# Patient Record
Sex: Female | Born: 1970 | Race: White | Hispanic: No | Marital: Married | Smoking: Never smoker
Health system: Southern US, Community
[De-identification: ages and names within clinical notes are randomized; demographics above are authoritative.]

## PROBLEM LIST (undated history)

## (undated) DIAGNOSIS — I10 Essential (primary) hypertension: Secondary | ICD-10-CM

## (undated) DIAGNOSIS — R87619 Unspecified abnormal cytological findings in specimens from cervix uteri: Secondary | ICD-10-CM

## (undated) DIAGNOSIS — I499 Cardiac arrhythmia, unspecified: Secondary | ICD-10-CM

## (undated) DIAGNOSIS — I341 Nonrheumatic mitral (valve) prolapse: Secondary | ICD-10-CM

## (undated) DIAGNOSIS — Z8619 Personal history of other infectious and parasitic diseases: Secondary | ICD-10-CM

## (undated) DIAGNOSIS — IMO0002 Reserved for concepts with insufficient information to code with codable children: Secondary | ICD-10-CM

## (undated) DIAGNOSIS — O09529 Supervision of elderly multigravida, unspecified trimester: Secondary | ICD-10-CM

## (undated) DIAGNOSIS — R011 Cardiac murmur, unspecified: Secondary | ICD-10-CM

## (undated) HISTORY — PX: DILATION AND CURETTAGE OF UTERUS: SHX78

## (undated) HISTORY — DX: Cardiac arrhythmia, unspecified: I49.9

## (undated) HISTORY — DX: Unspecified abnormal cytological findings in specimens from cervix uteri: R87.619

## (undated) HISTORY — DX: Personal history of other infectious and parasitic diseases: Z86.19

## (undated) HISTORY — PX: GYNECOLOGIC CRYOSURGERY: SHX857

## (undated) HISTORY — DX: Cardiac murmur, unspecified: R01.1

## (undated) HISTORY — DX: Nonrheumatic mitral (valve) prolapse: I34.1

## (undated) HISTORY — PX: FOOT SURGERY: SHX648

## (undated) HISTORY — PX: FACIAL COSMETIC SURGERY: SHX629

## (undated) HISTORY — DX: Supervision of elderly multigravida, unspecified trimester: O09.529

## (undated) HISTORY — PX: TONSILLECTOMY: SUR1361

## (undated) HISTORY — DX: Essential (primary) hypertension: I10

## (undated) HISTORY — DX: Reserved for concepts with insufficient information to code with codable children: IMO0002

---

## 2009-06-24 ENCOUNTER — Inpatient Hospital Stay (HOSPITAL_COMMUNITY): Admission: AD | Admit: 2009-06-24 | Discharge: 2009-06-26 | Payer: Self-pay | Admitting: Obstetrics and Gynecology

## 2010-10-14 ENCOUNTER — Inpatient Hospital Stay (HOSPITAL_COMMUNITY): Admission: AD | Admit: 2010-10-14 | Payer: Self-pay | Source: Ambulatory Visit | Admitting: Obstetrics and Gynecology

## 2010-11-03 ENCOUNTER — Encounter: Payer: Self-pay | Admitting: Obstetrics and Gynecology

## 2010-11-04 ENCOUNTER — Encounter: Payer: Self-pay | Admitting: Obstetrics and Gynecology

## 2011-01-18 LAB — CBC
Hemoglobin: 11.3 g/dL — ABNORMAL LOW (ref 12.0–15.0)
Hemoglobin: 11.9 g/dL — ABNORMAL LOW (ref 12.0–15.0)
MCHC: 34.6 g/dL (ref 30.0–36.0)
MCV: 94.6 fL (ref 78.0–100.0)
MCV: 95.6 fL (ref 78.0–100.0)
Platelets: 106 10*3/uL — ABNORMAL LOW (ref 150–400)
Platelets: 109 10*3/uL — ABNORMAL LOW (ref 150–400)
RBC: 3.18 MIL/uL — ABNORMAL LOW (ref 3.87–5.11)
RBC: 3.42 MIL/uL — ABNORMAL LOW (ref 3.87–5.11)
RDW: 12.9 % (ref 11.5–15.5)
WBC: 7.7 10*3/uL (ref 4.0–10.5)
WBC: 9.4 10*3/uL (ref 4.0–10.5)

## 2011-01-18 LAB — RPR: RPR Ser Ql: NONREACTIVE

## 2011-05-01 LAB — ABO/RH: RH Type: POSITIVE

## 2011-05-01 LAB — HIV ANTIBODY (ROUTINE TESTING W REFLEX): HIV: NONREACTIVE

## 2011-05-01 LAB — GC/CHLAMYDIA PROBE AMP, GENITAL
Chlamydia: NEGATIVE
Gonorrhea: NEGATIVE

## 2011-05-01 LAB — ANTIBODY SCREEN: Antibody Screen: NEGATIVE

## 2011-05-01 LAB — RUBELLA ANTIBODY, IGM: Rubella: NON-IMMUNE/NOT IMMUNE

## 2011-05-24 ENCOUNTER — Encounter: Payer: Self-pay | Admitting: *Deleted

## 2011-05-24 ENCOUNTER — Encounter: Payer: Self-pay | Admitting: Cardiovascular Disease

## 2011-05-28 ENCOUNTER — Ambulatory Visit (HOSPITAL_COMMUNITY): Payer: 59 | Attending: Obstetrics and Gynecology | Admitting: Radiology

## 2011-05-28 ENCOUNTER — Encounter: Payer: Self-pay | Admitting: *Deleted

## 2011-05-28 ENCOUNTER — Ambulatory Visit (INDEPENDENT_AMBULATORY_CARE_PROVIDER_SITE_OTHER): Payer: 59 | Admitting: Cardiovascular Disease

## 2011-05-28 VITALS — BP 106/74 | HR 76 | Ht 65.0 in | Wt 166.0 lb

## 2011-05-28 DIAGNOSIS — I491 Atrial premature depolarization: Secondary | ICD-10-CM | POA: Insufficient documentation

## 2011-05-28 DIAGNOSIS — I059 Rheumatic mitral valve disease, unspecified: Secondary | ICD-10-CM

## 2011-05-28 DIAGNOSIS — I341 Nonrheumatic mitral (valve) prolapse: Secondary | ICD-10-CM

## 2011-05-28 DIAGNOSIS — I2510 Atherosclerotic heart disease of native coronary artery without angina pectoris: Secondary | ICD-10-CM | POA: Insufficient documentation

## 2011-05-28 NOTE — Patient Instructions (Signed)
Your physician has requested that you have an echocardiogram. Echocardiography is a painless test that uses sound waves to create images of your heart. It provides your doctor with information about the size and shape of your heart and how well your heart's chambers and valves are working. This procedure takes approximately one hour. There are no restrictions for this procedure.  Your physician recommends that you schedule a follow-up appointment in: as needed with Dr. Eden Emms.

## 2011-05-28 NOTE — Assessment & Plan Note (Signed)
Echo.  Doubt true Barlows syndrome. No murmur on exam  No need for SBe

## 2011-05-28 NOTE — Assessment & Plan Note (Signed)
Benign not current. Likely related to stress.  Observe

## 2011-05-28 NOTE — Progress Notes (Signed)
40 yo G7 [id number] LMP 02/10/11  Who is now [redacted] weeks pregnant.  Referred by Dr Senaida Ores for PAC;s and history of MVP.  Diagnosed in 2000 but patient denies ever having an echo.  Had practiced SBE prophylaxis in past.  Has had gestational HTN in past but never post partum.  Has had previous palpitatons and PAC's during stress of divorce but stable now.  Pregnancy going well with no SSCP, dyspnea, palpitations or edema.  Baby doing well by [place].  Discussed diagnosis with patient.  Not clear to me that she has prolapse.  No murmur on exam and PAC;s may be benign.  Certainly does not need SBE.  Given relatively advanced age with pregnancy with do echo and further assess  Reviewed office notes from Dr Floreen Comber Ob-Gyn  ROS: Denies fever, malais, weight loss, blurry vision, decreased visual acuity, cough, sputum, SOB, hemoptysis, pleuritic pain, palpitaitons, heartburn, abdominal pain, melena, lower extremity edema, claudication, or rash.  All other systems reviewed and negative   General: Affect appropriate Healthy:  appears stated age HEENT: normal Neck supple with no adenopathy JVP normal no bruits no thyromegaly Lungs clear with no wheezing and good diaphragmatic motion Heart:  S1/S2 no murmur,rub, gallop or click PMI normal Abdomen: benighn, BS positve, no tenderness, no AAA no bruit.  No HSM or HJR Distal pulses intact with no bruits No edema Neuro non-focal Skin warm and dry No muscular weakness  Medications Current Outpatient Prescriptions  Medication Sig Dispense Refill  . Prenat w/o A Vit-FeFum-FePo-FA (CONCEPT OB) 130-92.4-1 MG CAPS 1 tab daily        Allergies Review of patient's allergies indicates no known allergies.  Family History: Family History  Problem Relation Age of Onset  . Arthritis Mother     grandmother  . Endometriosis Mother   . Fibromyalgia Mother   . Heart disease      grandparents (both sides of family)  . Hypertension Father     grandparents  (both sides of family)  . Other Mother     still birth  . Hypertension Mother     Social History: History   Social History  . Marital Status: Married    Spouse Name: N/A    Number of Children: N/A  . Years of Education: N/A   Occupational History  . Not on file.   Social History Main Topics  . Smoking status: Never Smoker   . Smokeless tobacco: Not on file  . Alcohol Use: No  . Drug Use: No  . Sexually Active: Not on file   Other Topics Concern  . Not on file   Social History Narrative  . No narrative on file    Electrocardiogram:  NSR 76  Normal ECG  Assessment and Plan

## 2011-05-31 NOTE — Progress Notes (Signed)
pt aware of results Hayley Powell  

## 2011-10-15 NOTE — L&D Delivery Note (Signed)
Delivery Note At 12:23 PM a healthy female was delivered via Vaginal, Spontaneous Delivery (Presentation: Left Occiput Anterior).  APGAR:8,9 weight 8#13oz  Placenta status: Intact, Spontaneous.  Cord:  with the following complications: None.  Anesthesia: epidural  Episiotomy: n/a Lacerations: bilateral labial lacerations Suture Repair: 3.0 vicryl rapide Est. Blood Loss (mL): 350cc  Mom to postpartum.  Baby to nursery-stable.  Hayley Powell 11/14/2011, 12:54 PM

## 2011-11-05 ENCOUNTER — Encounter (HOSPITAL_COMMUNITY): Payer: Self-pay | Admitting: *Deleted

## 2011-11-05 ENCOUNTER — Telehealth (HOSPITAL_COMMUNITY): Payer: Self-pay | Admitting: *Deleted

## 2011-11-05 NOTE — Telephone Encounter (Signed)
Preadmission screen  

## 2011-11-13 ENCOUNTER — Other Ambulatory Visit: Payer: Self-pay | Admitting: Obstetrics and Gynecology

## 2011-11-14 ENCOUNTER — Encounter (HOSPITAL_COMMUNITY): Payer: Self-pay

## 2011-11-14 ENCOUNTER — Encounter (HOSPITAL_COMMUNITY): Payer: Self-pay | Admitting: Anesthesiology

## 2011-11-14 ENCOUNTER — Inpatient Hospital Stay (HOSPITAL_COMMUNITY): Payer: 59 | Admitting: Anesthesiology

## 2011-11-14 ENCOUNTER — Inpatient Hospital Stay (HOSPITAL_COMMUNITY)
Admission: RE | Admit: 2011-11-14 | Discharge: 2011-11-15 | DRG: 774 | Disposition: A | Discharge status: Home / Self Care | Payer: 59 | Source: Ambulatory Visit | Attending: Obstetrics and Gynecology | Admitting: Obstetrics and Gynecology

## 2011-11-14 DIAGNOSIS — O99892 Other specified diseases and conditions complicating childbirth: Secondary | ICD-10-CM | POA: Diagnosis present

## 2011-11-14 DIAGNOSIS — O09529 Supervision of elderly multigravida, unspecified trimester: Secondary | ICD-10-CM | POA: Diagnosis present

## 2011-11-14 DIAGNOSIS — O139 Gestational [pregnancy-induced] hypertension without significant proteinuria, unspecified trimester: Secondary | ICD-10-CM | POA: Diagnosis present

## 2011-11-14 DIAGNOSIS — I059 Rheumatic mitral valve disease, unspecified: Secondary | ICD-10-CM | POA: Diagnosis present

## 2011-11-14 DIAGNOSIS — I2510 Atherosclerotic heart disease of native coronary artery without angina pectoris: Secondary | ICD-10-CM | POA: Diagnosis present

## 2011-11-14 DIAGNOSIS — Z22330 Carrier of Group B streptococcus: Secondary | ICD-10-CM

## 2011-11-14 LAB — COMPREHENSIVE METABOLIC PANEL
ALT: 16 U/L (ref 0–35)
Calcium: 9.1 mg/dL (ref 8.4–10.5)
GFR calc Af Amer: >90 mL/min (ref >90)
Glucose, Bld: 78 mg/dL (ref 70–99)
Sodium: 135 mEq/L (ref 135–145)
Total Protein: 6.2 g/dL (ref 6.0–8.3)

## 2011-11-14 LAB — URIC ACID: Uric Acid, Serum: 5.9 mg/dL (ref 2.4–7.0)

## 2011-11-14 LAB — CBC
Hemoglobin: 13.7 g/dL (ref 12.0–15.0)
MCH: 32.2 pg (ref 26.0–34.0)
MCHC: 34.9 g/dL (ref 30.0–36.0)
Platelets: 104 10*3/uL — ABNORMAL LOW (ref 150–400)
Platelets: 108 10*3/uL — ABNORMAL LOW (ref 150–400)
RDW: 13.1 % (ref 11.5–15.5)
WBC: 13.3 10*3/uL — ABNORMAL HIGH (ref 4.0–10.5)

## 2011-11-14 LAB — URINALYSIS, ROUTINE W REFLEX MICROSCOPIC
Glucose, UA: NEGATIVE mg/dL
Leukocytes, UA: NEGATIVE
Nitrite: NEGATIVE
Protein, ur: NEGATIVE mg/dL
Urobilinogen, UA: 0.2 mg/dL (ref 0.0–1.0)

## 2011-11-14 LAB — URINE MICROSCOPIC-ADD ON

## 2011-11-14 MED ORDER — ONDANSETRON HCL 4 MG/2ML IJ SOLN: 4.0000 mg | Freq: Four times a day (QID) | INTRAMUSCULAR | Status: DC | PRN

## 2011-11-14 MED ORDER — LACTATED RINGERS IV SOLN: INTRAVENOUS | Status: DC

## 2011-11-14 MED ORDER — FLEET ENEMA 7-19 GM/118ML RE ENEM: 1.0000 | ENEMA | RECTAL | Status: DC | PRN

## 2011-11-14 MED ORDER — DEXTROSE 5 % IV SOLN
2.5000 10*6.[IU] | INTRAVENOUS | Status: DC
  Administered 2011-11-14: 2.5 10*6.[IU] via INTRAVENOUS
  Filled 2011-11-14 (×5): qty 2.5

## 2011-11-14 MED ORDER — ACETAMINOPHEN 325 MG PO TABS: 650.0000 mg | ORAL_TABLET | ORAL | Status: DC | PRN

## 2011-11-14 MED ORDER — BENZOCAINE-MENTHOL 20-0.5 % EX AERO
1.0000 "application " | INHALATION_SPRAY | CUTANEOUS | Status: DC | PRN
  Administered 2011-11-14 – 2011-11-15 (×2): 1 via TOPICAL

## 2011-11-14 MED ORDER — DIPHENHYDRAMINE HCL 25 MG PO CAPS: 25.0000 mg | ORAL_CAPSULE | Freq: Four times a day (QID) | ORAL | Status: DC | PRN

## 2011-11-14 MED ORDER — IBUPROFEN 600 MG PO TABS: 600.0000 mg | ORAL_TABLET | Freq: Four times a day (QID) | ORAL | Status: DC | PRN

## 2011-11-14 MED ORDER — SIMETHICONE 80 MG PO CHEW: 80.0000 mg | CHEWABLE_TABLET | ORAL | Status: DC | PRN

## 2011-11-14 MED ORDER — PRENATAL MULTIVITAMIN CH
1.0000 | ORAL_TABLET | Freq: Every day | ORAL | Status: DC
  Administered 2011-11-14 – 2011-11-15 (×2): 1 via ORAL
  Filled 2011-11-14 (×2): qty 1

## 2011-11-14 MED ORDER — OXYCODONE-ACETAMINOPHEN 5-325 MG PO TABS: 1.0000 | ORAL_TABLET | ORAL | Status: DC | PRN

## 2011-11-14 MED ORDER — IBUPROFEN 600 MG PO TABS
600.0000 mg | ORAL_TABLET | Freq: Four times a day (QID) | ORAL | Status: DC
  Administered 2011-11-14 – 2011-11-15 (×3): 600 mg via ORAL
  Filled 2011-11-14 (×3): qty 1

## 2011-11-14 MED ORDER — OXYTOCIN 20 UNITS IN LACTATED RINGERS INFUSION - SIMPLE
1.0000 m[IU]/min | INTRAVENOUS | Status: DC
  Administered 2011-11-14: 2 m[IU]/min via INTRAVENOUS
  Filled 2011-11-14: qty 1000

## 2011-11-14 MED ORDER — BENZOCAINE-MENTHOL 20-0.5 % EX AERO
INHALATION_SPRAY | CUTANEOUS | Status: AC
  Administered 2011-11-14: 1 via TOPICAL
  Filled 2011-11-14: qty 56

## 2011-11-14 MED ORDER — PHENYLEPHRINE 40 MCG/ML (10ML) SYRINGE FOR IV PUSH (FOR BLOOD PRESSURE SUPPORT)
80.0000 ug | PREFILLED_SYRINGE | INTRAVENOUS | Status: DC | PRN
  Filled 2011-11-14: qty 5

## 2011-11-14 MED ORDER — FENTANYL 2.5 MCG/ML BUPIVACAINE 1/10 % EPIDURAL INFUSION (WH - ANES)
14.0000 mL/h | INTRAMUSCULAR | Status: DC
  Filled 2011-11-14: qty 60

## 2011-11-14 MED ORDER — PHENYLEPHRINE 40 MCG/ML (10ML) SYRINGE FOR IV PUSH (FOR BLOOD PRESSURE SUPPORT): 80.0000 ug | PREFILLED_SYRINGE | INTRAVENOUS | Status: DC | PRN

## 2011-11-14 MED ORDER — EPHEDRINE 5 MG/ML INJ: 10.0000 mg | INTRAVENOUS | Status: DC | PRN

## 2011-11-14 MED ORDER — ONDANSETRON HCL 4 MG PO TABS: 4.0000 mg | ORAL_TABLET | ORAL | Status: DC | PRN

## 2011-11-14 MED ORDER — OXYTOCIN 20 UNITS IN LACTATED RINGERS INFUSION - SIMPLE
125.0000 mL/h | Freq: Once | INTRAVENOUS | Status: AC
  Administered 2011-11-14: 125 mL/h via INTRAVENOUS

## 2011-11-14 MED ORDER — CITRIC ACID-SODIUM CITRATE 334-500 MG/5ML PO SOLN: 30.0000 mL | ORAL | Status: DC | PRN

## 2011-11-14 MED ORDER — FENTANYL 2.5 MCG/ML BUPIVACAINE 1/10 % EPIDURAL INFUSION (WH - ANES)
INTRAMUSCULAR | Status: DC | PRN
  Administered 2011-11-14: 14 mL/h via EPIDURAL

## 2011-11-14 MED ORDER — WITCH HAZEL-GLYCERIN EX PADS: 1.0000 "application " | MEDICATED_PAD | CUTANEOUS | Status: DC | PRN

## 2011-11-14 MED ORDER — EPHEDRINE 5 MG/ML INJ
10.0000 mg | INTRAVENOUS | Status: DC | PRN
  Filled 2011-11-14: qty 4

## 2011-11-14 MED ORDER — LIDOCAINE HCL 1.5 % IJ SOLN
INTRAMUSCULAR | Status: DC | PRN
  Administered 2011-11-14 (×2): 5 mL via EPIDURAL

## 2011-11-14 MED ORDER — LACTATED RINGERS IV SOLN: 500.0000 mL | Freq: Once | INTRAVENOUS | Status: DC

## 2011-11-14 MED ORDER — OXYTOCIN BOLUS FROM INFUSION
500.0000 mL | Freq: Once | INTRAVENOUS | Status: DC
  Filled 2011-11-14: qty 500

## 2011-11-14 MED ORDER — ONDANSETRON HCL 4 MG/2ML IJ SOLN: 4.0000 mg | INTRAMUSCULAR | Status: DC | PRN

## 2011-11-14 MED ORDER — LACTATED RINGERS IV SOLN
500.0000 mL | INTRAVENOUS | Status: DC | PRN
  Administered 2011-11-14: 1000 mL via INTRAVENOUS

## 2011-11-14 MED ORDER — TERBUTALINE SULFATE 1 MG/ML IJ SOLN: 0.2500 mg | Freq: Once | INTRAMUSCULAR | Status: DC | PRN

## 2011-11-14 MED ORDER — SENNOSIDES-DOCUSATE SODIUM 8.6-50 MG PO TABS
2.0000 | ORAL_TABLET | Freq: Every day | ORAL | Status: DC
  Administered 2011-11-14: 2 via ORAL

## 2011-11-14 MED ORDER — LANOLIN HYDROUS EX OINT: TOPICAL_OINTMENT | CUTANEOUS | Status: DC | PRN

## 2011-11-14 MED ORDER — LIDOCAINE HCL (PF) 1 % IJ SOLN: 30.0000 mL | INTRAMUSCULAR | Status: DC | PRN

## 2011-11-14 MED ORDER — PENICILLIN G POTASSIUM 5000000 UNITS IJ SOLR
5.0000 10*6.[IU] | Freq: Once | INTRAVENOUS | Status: AC
  Administered 2011-11-14: 5 10*6.[IU] via INTRAVENOUS
  Filled 2011-11-14: qty 5

## 2011-11-14 MED ORDER — ZOLPIDEM TARTRATE 5 MG PO TABS: 5.0000 mg | ORAL_TABLET | Freq: Every evening | ORAL | Status: DC | PRN

## 2011-11-14 MED ORDER — DIBUCAINE 1 % RE OINT: 1.0000 "application " | TOPICAL_OINTMENT | RECTAL | Status: DC | PRN

## 2011-11-14 MED ORDER — DIPHENHYDRAMINE HCL 50 MG/ML IJ SOLN: 12.5000 mg | INTRAMUSCULAR | Status: DC | PRN

## 2011-11-14 MED ORDER — TETANUS-DIPHTH-ACELL PERTUSSIS 5-2.5-18.5 LF-MCG/0.5 IM SUSP: 0.5000 mL | Freq: Once | INTRAMUSCULAR | Status: DC

## 2011-11-14 NOTE — H&P (Signed)
Hayley Powell is a 41 y.o. female [phone] at 39+ weeks (EDD 11/17/11 by LMP c/w 9 week [place]) presenting for induction of labor at term with a favorable cervix.  Prenatal care significant for some elevated BP this last week of pregnancy but no proteinuria or signs of preeclampsia.  She had some palpitations early in pregnancy evaluated by Dr. Eden Emms and had normal cardiac ECHO with only PAC's diagnosed.  PT is also GBS positive and plans ESsure postpartum.  History OB History    Grav Para Term Preterm Abortions TAB SAB Ect Mult Living   7 3 3  3 1 2   3     1992 NSVD 8#9oz 1994 NSVD 8#13oz 2008 SAB 2008 EAB 2010 NSVD 7#5oz 2012 SAB  Past Medical History  Diagnosis Date  . HTN (hypertension) -  . Mitral valve prolapse   . Dysrhythmia   . AMA (advanced maternal age) multigravida 35+   . Abnormal Pap smear     ASCUS  . Heart murmur     antibiotic with dental work  . History of Rocky Mountain spotted fever    Past Surgical History  Procedure Date  . Dilation and curettage of uterus   . Tonsillectomy   . Facial cosmetic surgery   . Foot surgery   . Breast fibroadenoma surgery   . Gynecologic cryosurgery    Family History: family history includes Arthritis in her maternal grandmother and mother; Endometriosis in her mother; Fibromyalgia in her mother; Heart disease in her maternal grandfather, maternal grandmother, paternal grandmother, and unspecified family member; Hypertension in her father, maternal grandfather, maternal grandmother, mother, paternal grandfather, and paternal grandmother; Miscarriages / [place] in her mother; and Other in her mother. Social History:  reports that she has never smoked. She does not have any smokeless tobacco history on file. She reports that she does not drink alcohol or use illicit drugs.  ROS    Last menstrual period 02/10/2011. Maternal Exam:  Uterine Assessment: Contraction strength is mild.  Contraction frequency is irregular.   Abdomen:  Patient reports no abdominal tenderness. Fetal presentation: vertex  Introitus: Normal vulva. Normal vagina.    Physical Exam  Constitutional: She is oriented to person, place, and time. She appears well-developed and well-nourished.  Cardiovascular: Normal rate and regular rhythm.   Respiratory: Effort normal and breath sounds normal.  GI: Soft.  Genitourinary: Vagina normal and uterus normal.  Neurological: She is alert and oriented to person, place, and time.  Psychiatric: She has a normal mood and affect. Her behavior is normal.    Prenatal labs: ABO, Rh: A/Positive/-- (07/18 0000) Antibody: Negative (07/18 0000) Rubella: Nonimmune (07/18 0000) RPR: Nonreactive (07/18 0000)  HBsAg: Negative (07/18 0000)  HIV: Non-reactive (07/18 0000)  GBS: Positive (01/04 0000)  First trimester screen negative, declined AFP One hour glucola 107  Assessment/Plan: Pitocin induction with favorable cervix.  Has had elevated BP over last week but no signs of preeclampsia. Will check PIH labs and urine for protein.  Plans epidural.  GBS positive--PCN ordered  Oliver Pila 11/14/2011, 7:38 AM

## 2011-11-14 NOTE — Progress Notes (Signed)
UR Chart review completed.  

## 2011-11-14 NOTE — Progress Notes (Signed)
   Subjective: Feeling mild ctx  Objective: BP 133/87  Pulse 76  Resp 20  LMP 02/10/2011      FHT:  FHR: 130 bpm, variability: moderate,  accelerations:  Present,  decelerations:  Absent UC:   irregular, every 3-5 minutes SVE:   Dilation: 3.5 Effacement (%): 70 Station: -1 Exam by:: [name] AROM clear  Labs: Lab Results  Component Value Date   WBC 9.4 06/26/2009   HGB 10.6* 06/26/2009   HCT 30.4* 06/26/2009   MCV 95.6 06/26/2009   PLT 109* 06/26/2009    Assessment / Plan: Received PCN Plans epidural  Labs WNL BP 130/87  Hayley Powell W 11/14/2011, 8:56 AM

## 2011-11-14 NOTE — Progress Notes (Signed)
Patient ID: Hayley Powell, female   DOB: Apr 10, 1971, 41 y.o.   MRN: [id number] Pt's labs from this AM reviewed.  LFT's WNL but platelets were 104,0000.  Antenatally 178,000.  Will check urine for protein to r/o preeclampsia.  BP 130-150/80-90's.  Pt with no symptoms.

## 2011-11-14 NOTE — Anesthesia Postprocedure Evaluation (Signed)
  Anesthesia Post-op Note  Patient: Hayley Powell  Procedure(s) Performed: * No procedures listed *  Patient Location: 110   Anesthesia Type: Epidural  Level of Consciousness: awake, alert  and oriented  Airway and Oxygen Therapy: Patient Spontanous Breathing  Post-op Pain: none  Post-op Assessment: Post-op Vital signs reviewed, Patient's Cardiovascular Status Stable, No headache, No backache, No residual numbness and No residual motor weakness  Post-op Vital Signs: Reviewed and stable  Complications: No apparent anesthesia complications

## 2011-11-14 NOTE — Anesthesia Preprocedure Evaluation (Signed)
Anesthesia Evaluation  Patient identified by MRN, date of birth, ID band Patient awake    Reviewed: Allergy & Precautions, H&P , Patient's Chart, lab work & pertinent test results  Airway Mallampati: II TM Distance: >3 FB Neck ROM: full    Dental No notable dental hx.    Pulmonary neg pulmonary ROS,  clear to auscultation  Pulmonary exam normal       Cardiovascular hypertension, neg cardio ROS + dysrhythmias + Valvular Problems/Murmurs MVP regular Normal    Neuro/Psych Negative Neurological ROS  Negative Psych ROS   GI/Hepatic negative GI ROS, Neg liver ROS,   Endo/Other  Negative Endocrine ROS  Renal/GU negative Renal ROS     Musculoskeletal   Abdominal   Peds  Hematology negative hematology ROS (+)   Anesthesia Other Findings   Reproductive/Obstetrics (+) Pregnancy                           Anesthesia Physical Anesthesia Plan  ASA: III  Anesthesia Plan: Epidural   Post-op Pain Management:    Induction:   Airway Management Planned:   Additional Equipment:   Intra-op Plan:   Post-operative Plan:   Informed Consent: I have reviewed the patients History and Physical, chart, labs and discussed the procedure including the risks, benefits and alternatives for the proposed anesthesia with the patient or authorized representative who has indicated his/her understanding and acceptance.     Plan Discussed with:   Anesthesia Plan Comments:         Anesthesia Quick Evaluation

## 2011-11-14 NOTE — Anesthesia Procedure Notes (Signed)
Epidural Patient location during procedure: OB Start time: 11/14/2011 10:32 AM  Staffing Anesthesiologist: Brayton Caves R Performed by: anesthesiologist   Preanesthetic Checklist Completed: patient identified, site marked, surgical consent, pre-op evaluation, timeout performed, IV checked, risks and benefits discussed and monitors and equipment checked  Epidural Patient position: sitting Prep: site prepped and draped and DuraPrep Patient monitoring: continuous pulse ox and blood pressure Approach: midline Injection technique: LOR air and LOR saline  Needle:  Needle type: Tuohy  Needle gauge: 17 G Needle length: 9 cm Needle insertion depth: 5 cm cm Catheter type: closed end flexible Catheter size: 19 Gauge Catheter at skin depth: 10 cm Test dose: negative  Assessment Events: blood not aspirated, injection not painful, no injection resistance, negative IV test and no paresthesia  Additional Notes Patient identified.  Risk benefits discussed including failed block, incomplete pain control, headache, nerve damage, paralysis, blood pressure changes, nausea, vomiting, reactions to medication both toxic or allergic, and postpartum back pain.  Patient expressed understanding and wished to proceed.  All questions were answered.  Sterile technique used throughout procedure and epidural site dressed with sterile barrier dressing. No paresthesia or other complications noted.The patient did not experience any signs of intravascular injection such as tinnitus or metallic taste in mouth nor signs of intrathecal spread such as rapid motor block. Please see nursing notes for vital signs.

## 2011-11-14 NOTE — Progress Notes (Signed)
Patient ID: Hayley Powell, female   DOB: [birth year], 42 y.o.   MRN: [id number] Pateint's urine negative for protein, platelets with her last delivery in 2010 were 109,000 so this most likely represents gestational thrombocytopenia.  Platelets will be checked again prior to removal of epidural and will follow BP postpartum.

## 2011-11-15 LAB — CBC
Hemoglobin: 10.8 g/dL — ABNORMAL LOW (ref 12.0–15.0)
MCHC: 34 g/dL (ref 30.0–36.0)
RBC: 3.42 MIL/uL — ABNORMAL LOW (ref 3.87–5.11)
WBC: 8.1 10*3/uL (ref 4.0–10.5)

## 2011-11-15 MED ORDER — IBUPROFEN 600 MG PO TABS: 600.0000 mg | ORAL_TABLET | Freq: Four times a day (QID) | ORAL | Status: AC

## 2011-11-15 MED ORDER — BENZOCAINE-MENTHOL 20-0.5 % EX AERO
INHALATION_SPRAY | CUTANEOUS | Status: AC
  Administered 2011-11-15: 1 via TOPICAL
  Filled 2011-11-15: qty 56

## 2011-11-15 NOTE — Discharge Summary (Signed)
Obstetric Discharge Summary Reason for Admission: induction of labor Prenatal Procedures: none Intrapartum Procedures: spontaneous vaginal delivery Postpartum Procedures: none Complications-Operative and Postpartum: Bilateral labial abrasions Hemoglobin  Date Value Range Status  11/15/2011 10.8* 12.0-15.0 (g/dL) Final     DELTA CHECK NOTED     REPEATED TO VERIFY     HCT  Date Value Range Status  11/15/2011 31.8* 36.0-46.0 (%) Final    Discharge Diagnoses: Term Pregnancy-delivered                                         Gestational Hypertension Discharge Information: Date: 11/15/2011 Activity: pelvic rest Diet: routine Medications: Ibuprofen Condition: improved Instructions: refer to practice specific booklet Discharge to: home   Newborn Data: Live born female  Birth Weight: 8 lb 13.3 oz (4006 g) APGAR: 9, 9  Home with mother.  Oliver Pila 11/15/2011, 7:05 AM

## 2011-11-15 NOTE — Progress Notes (Signed)
Post Partum Day 1 Subjective: no complaints, up ad lib, tolerating PO and and requests early d/c  Objective: Blood pressure 136/90, pulse 77, temperature 97.7 F (36.5 C), temperature source Oral, resp. rate 19, last menstrual period 02/10/2011, unknown if currently breastfeeding.  Physical Exam:  General: alert Lochia: appropriate Uterine Fundus: firm    Basename 11/15/11 0535 11/14/11 1348  HGB 10.8* 13.1  HCT 31.8* 38.0    Assessment/Plan: Discharge home Motrin Plans essure at postpartum   LOS: 1 day   Dorismar Chay W 11/15/2011, 7:02 AM

## 2012-05-07 ENCOUNTER — Other Ambulatory Visit (HOSPITAL_COMMUNITY): Payer: Self-pay | Admitting: Obstetrics and Gynecology

## 2012-05-07 DIAGNOSIS — N971 Female infertility of tubal origin: Secondary | ICD-10-CM

## 2012-05-20 ENCOUNTER — Ambulatory Visit (HOSPITAL_COMMUNITY): Payer: 59

## 2013-08-10 ENCOUNTER — Ambulatory Visit: Payer: Self-pay | Admitting: Podiatry

## 2014-01-25 ENCOUNTER — Ambulatory Visit: Payer: Self-pay

## 2014-02-04 ENCOUNTER — Ambulatory Visit (INDEPENDENT_AMBULATORY_CARE_PROVIDER_SITE_OTHER): Payer: 59

## 2014-02-04 VITALS — BP 133/87 | HR 67 | Resp 15 | Ht 65.0 in | Wt 158.0 lb

## 2014-02-04 DIAGNOSIS — M79609 Pain in unspecified limb: Secondary | ICD-10-CM

## 2014-02-04 DIAGNOSIS — M21619 Bunion of unspecified foot: Secondary | ICD-10-CM

## 2014-02-04 DIAGNOSIS — M2010 Hallux valgus (acquired), unspecified foot: Secondary | ICD-10-CM

## 2014-02-04 NOTE — Patient Instructions (Signed)
Pre-Operative Instructions  Congratulations, you have decided to take an important step to improving your quality of life.  You can be assured that the doctors of [facility] will be with you every step of the way.  1. Plan to be at the surgery center/hospital at least 1 (one) hour prior to your scheduled time unless otherwise directed by the surgical center/hospital staff.  You must have a responsible adult accompany you, remain during the surgery and drive you home.  Make sure you have directions to the surgical center/hospital and know how to get there on time. 2. For hospital based surgery you will need to obtain a history and physical form from your family physician within 1 month prior to the date of surgery- we will give you a form for you primary physician.  3. We make every effort to accommodate the date you request for surgery.  There are however, times where surgery dates or times have to be moved.  We will contact you as soon as possible if a change in schedule is required.   4. No Aspirin/Ibuprofen for one week before surgery.  If you are on aspirin, any non-steroidal anti-inflammatory medications (Mobic, Aleve, Ibuprofen) you should stop taking it 7 days prior to your surgery.  You make take Tylenol  For pain prior to surgery.  5. Medications- If you are taking daily heart and blood pressure medications, seizure, reflux, allergy, asthma, anxiety, pain or diabetes medications, make sure the surgery center/hospital is aware before the day of surgery so they may notify you which medications to take or avoid the day of surgery. 6. No food or drink after midnight the night before surgery unless directed otherwise by surgical center/hospital staff. 7. No alcoholic beverages 24 hours prior to surgery.  No smoking 24 hours prior to or 24 hours after surgery. 8. Wear loose pants or shorts- loose enough to fit over bandages, boots, and casts. 9. No slip on shoes, sneakers are best. 10. Bring  your boot with you to the surgery center/hospital.  Also bring crutches or a walker if your physician has prescribed it for you.  If you do not have this equipment, it will be provided for you after surgery. 11. If you have not been contracted by the surgery center/hospital by the day before your surgery, call to confirm the date and time of your surgery. 12. Leave-time from work may vary depending on the type of surgery you have.  Appropriate arrangements should be made prior to surgery with your employer. 13. Prescriptions will be provided immediately following surgery by your doctor.  Have these filled as soon as possible after surgery and take the medication as directed. 14. Remove nail polish on the operative foot. 15. Wash the night before surgery.  The night before surgery wash the foot and leg well with the antibacterial soap provided and water paying special attention to beneath the toenails and in between the toes.  Rinse thoroughly with water and dry well with a towel.  Perform this wash unless told not to do so by your physician.  Enclosed: 1 Ice pack (please put in freezer the night before surgery)   1 Hibiclens skin cleaner   Pre-op Instructions  If you have any questions regarding the instructions, do not hesitate to call our office.  [place]: [address] [place], [place] [address] [phone]  [place]: [address] [place], [place] [address] [phone]  [place]: [address]  [place], [place] [address] [phone]  Dr. Tristyn Demarest   Tuchman DPM, Dr. Norman Regal DPM Dr. Herberth Deharo DPM, Dr. M. Todd Hyatt DPM, Dr. Kathryn Egerton DPM 

## 2014-02-04 NOTE — Progress Notes (Signed)
   Subjective:    Patient ID: Hayley Powell, female    DOB: Jun 20, 1971, 43 y.o.   MRN: [id number]  HPI Comments: N bunions L right 1st MPJ  D 4 to 5 month, worsening O C enlarged, red, painful with pain radiating in to arch A enclosed shoes and pressure T hx of bunion surgery left foot, wider shoes     Review of Systems  All other systems reviewed and are negative.      Objective:   Physical Exam 43 year old white female well-developed well-nourished oriented x3 presents this time with a complaint of painful bunion has a several year history of bunion in 1999 at correction of her left great toe with a modified Austin bunionectomy and screw fixation with good success at this time and the last 6 months is developing more pain discomfort with walking activities the shoe wear is painful and symptomatic the hallux is starting first more laterally in the bony prominence of the first great toe joint is more evident. Lower extremity objective findings as follows pedal pulses are palpable DP and PT +2/4 bilateral Refill time 3 seconds all digits epicritic and proprioceptive sensations intact and symmetric bilateral patient has some slight decreased sensation in the incision scar site from previous bunion surgery left foot although his orthopedic biomechanical exam extra range of motion both great toe joint there is significant HAV deformity on the right is post left there is elevated I am angle 14 hallux abductus angle 35 deviation sesamoid position 6 there is no crepitus no significant arthrosis of the joint or capsulitis both on palpation range of motion is no       Assessment & Plan:  Assessment this time is notable bunion deformity with hallux abductovalgus deformity right great toe joint plan at this time patient is a strong candidate for surgical intervention for modified Austin bunionectomy with screw fixation placement risk L. Are reviewed all questions asked by the patient are answered  there no contraindications to surgery will be scheduled [facility]. Consent forms are reviewed and signed and surgery scheduled at this time. [facility] with cavus modification possibly 1-2 Screw Pl. Should note patient will be in air fracture boot for 1 month postoperatively for approximately 5 weeks weightbearing throughout the entire time plan for IV sedation and regional anesthetic block.  Alvan Dameichard Nickisha Hum DPM

## 2014-02-11 ENCOUNTER — Telehealth: Payer: Self-pay | Admitting: *Deleted

## 2014-02-11 NOTE — Telephone Encounter (Signed)
I have a family emergency, have to go out of town.  Will need to cancel my surgery for Monday.  I'll call back to reschedule.  I informed Dr. Ralene CorkSikora and canceled at the surgical center.

## 2015-10-27 ENCOUNTER — Other Ambulatory Visit: Payer: Self-pay | Admitting: Obstetrics and Gynecology

## 2015-10-27 DIAGNOSIS — R928 Other abnormal and inconclusive findings on diagnostic imaging of breast: Secondary | ICD-10-CM

## 2015-11-02 ENCOUNTER — Ambulatory Visit
Admission: RE | Admit: 2015-11-02 | Discharge: 2015-11-02 | Disposition: A | Discharge status: Home / Self Care | Payer: Commercial Managed Care - HMO | Source: Ambulatory Visit | Attending: Obstetrics and Gynecology | Admitting: Obstetrics and Gynecology

## 2015-11-02 DIAGNOSIS — R928 Other abnormal and inconclusive findings on diagnostic imaging of breast: Secondary | ICD-10-CM

## 2017-07-24 ENCOUNTER — Ambulatory Visit: Payer: 59 | Admitting: Podiatry

## 2017-07-31 ENCOUNTER — Ambulatory Visit: Payer: 59 | Admitting: Podiatry

## 2017-08-06 ENCOUNTER — Encounter: Payer: Self-pay | Admitting: Obstetrics and Gynecology

## 2018-05-09 ENCOUNTER — Ambulatory Visit: Payer: 59 | Admitting: Podiatry

## 2018-05-09 ENCOUNTER — Encounter: Payer: Self-pay | Admitting: Podiatry

## 2018-05-09 ENCOUNTER — Ambulatory Visit (INDEPENDENT_AMBULATORY_CARE_PROVIDER_SITE_OTHER): Payer: 59

## 2018-05-09 VITALS — BP 115/71 | HR 61 | Resp 16

## 2018-05-09 DIAGNOSIS — M779 Enthesopathy, unspecified: Secondary | ICD-10-CM

## 2018-05-09 DIAGNOSIS — M778 Other enthesopathies, not elsewhere classified: Secondary | ICD-10-CM

## 2018-05-09 DIAGNOSIS — R635 Abnormal weight gain: Secondary | ICD-10-CM | POA: Insufficient documentation

## 2018-05-09 DIAGNOSIS — I10 Essential (primary) hypertension: Secondary | ICD-10-CM | POA: Insufficient documentation

## 2018-05-09 DIAGNOSIS — M2011 Hallux valgus (acquired), right foot: Secondary | ICD-10-CM

## 2018-05-09 NOTE — Progress Notes (Signed)
  Subjective:  Patient ID: Hayley Powell, female    DOB: 02-26-1971,  MRN: [id number] HPI Chief Complaint  Patient presents with  . Foot Pain    1st MPJ right - deformity for years, but now having increased pain x several months, tries to wear wide or open shoes, gets red and hurts a lot across the top of the foot  . New Patient (Initial Visit)    Est pt [id number]    47 y.o. female presents with the above complaint.   ROS: Denies fever chills nausea vomiting muscle aches pains calf pain back pain chest pain shortness of breath.  Past Medical History:  Diagnosis Date  . Abnormal Pap smear    ASCUS  . AMA (advanced maternal age) multigravida 35+   . Dysrhythmia   . Heart murmur    antibiotic with dental work  . [facility] of [facility] fever   . HTN (hypertension) -  . Mitral valve prolapse    Past Surgical History:  Procedure Laterality Date  . DILATION AND CURETTAGE OF UTERUS    . FACIAL COSMETIC SURGERY    . FOOT SURGERY    . GYNECOLOGIC CRYOSURGERY    . TONSILLECTOMY      Current Outpatient Medications:  .  hydrochlorothiazide (HYDRODIURIL) 25 MG tablet, Take by mouth., Disp: , Rfl:  .  lisinopril (PRINIVIL,ZESTRIL) 10 MG tablet, Take 10 mg by mouth daily., Disp: , Rfl:   No Known Allergies Review of Systems Objective:   Vitals:   05/09/18 1027  BP: 115/71  Pulse: 61  Resp: 16    General: Well developed, nourished, in no acute distress, alert and oriented x3   Dermatological: Skin is warm, dry and supple bilateral. Nails x 10 are well maintained; remaining integument appears unremarkable at this time. There are no open sores, no preulcerative lesions, no rash or signs of infection present.  Vascular: Dorsalis Pedis artery and Posterior Tibial artery pedal pulses are 2/4 bilateral with immedate capillary fill time. Pedal hair growth present. No varicosities and no lower extremity edema present bilateral.   Neruologic: Grossly intact via light touch  bilateral. Vibratory intact via tuning fork bilateral. Protective threshold with Semmes Wienstein monofilament intact to all pedal sites bilateral. Patellar and Achilles deep tendon reflexes 2+ bilateral. No Babinski or clonus noted bilateral.   Musculoskeletal: No gross boney pedal deformities bilateral. No pain, crepitus, or limitation noted with foot and ankle range of motion bilateral. Muscular strength 5/5 in all groups tested bilateral.  Gait: Unassisted, Nonantalgic.    Radiographs:  Radiographs taken today demonstrate an osseously mature individual with pes planus and a increase in the first intermetatarsal angle greater than normal value.  Hallux abductus angle greater than normal value with early dislocation of the first metatarsal phalangeal joint and hypertrophic medial condyle.  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  Plan: We discussed etiology pathology conservative versus surgical therapies.  She would like to have surgery the first of the year [place]  In the meantime we are going to get her scheduled with Raiford Nobleick for a pair of orthotics to help prevent overpronation.  Hopefully this will lessen the pain across the dorsum of the foot.     Micki Cassel T. PetoskeyHyatt, [place] Dakota[place]PM

## 2018-05-09 NOTE — Patient Instructions (Signed)

## 2018-05-12 ENCOUNTER — Ambulatory Visit (INDEPENDENT_AMBULATORY_CARE_PROVIDER_SITE_OTHER): Payer: 59 | Admitting: Orthotics

## 2018-05-12 DIAGNOSIS — M7752 Other enthesopathy of left foot: Secondary | ICD-10-CM

## 2018-05-12 DIAGNOSIS — M779 Enthesopathy, unspecified: Secondary | ICD-10-CM

## 2018-05-12 DIAGNOSIS — M7751 Other enthesopathy of right foot: Secondary | ICD-10-CM | POA: Diagnosis not present

## 2018-05-12 DIAGNOSIS — M2011 Hallux valgus (acquired), right foot: Secondary | ICD-10-CM

## 2018-05-12 DIAGNOSIS — M778 Other enthesopathies, not elsewhere classified: Secondary | ICD-10-CM

## 2018-05-12 NOTE — Progress Notes (Signed)
Patient seen today for casting CMFO per Dr. Al CorpusHyatt.  Patient has significant HAV R and is concerned of dorsum discomfort while on vacation [place] and [place].   Scanned today for  CMFO w/ longitudinal arch support and valgus ff wedging.  4* RF kirby skive to add supinatory torque to f/o.  Shawnee KnappLevy

## 2018-05-19 ENCOUNTER — Ambulatory Visit: Payer: 59 | Admitting: Podiatry

## 2018-06-02 ENCOUNTER — Encounter: Payer: 59 | Admitting: Orthotics

## 2018-06-08 ENCOUNTER — Encounter: Payer: 59 | Admitting: Orthotics

## 2018-09-29 ENCOUNTER — Encounter (INDEPENDENT_AMBULATORY_CARE_PROVIDER_SITE_OTHER): Payer: 59 | Admitting: Podiatry

## 2018-09-29 NOTE — Progress Notes (Signed)
This encounter was created in error - please disregard.

## 2018-11-02 ENCOUNTER — Encounter: Payer: 59 | Admitting: Orthotics

## 2018-11-05 ENCOUNTER — Encounter: Payer: 59 | Admitting: Orthotics

## 2019-12-08 ENCOUNTER — Telehealth: Payer: Self-pay | Admitting: Podiatry

## 2019-12-08 NOTE — Telephone Encounter (Signed)
Pt left message about receiving a call from a collection agency about a bill from our office from 2019. She said she thinks it was for the orthotics she called and spoke to someone and told them to cancel the order for the orthotics. Pt would like to confirm the balance was for orthotics.

## 2020-06-25 ENCOUNTER — Emergency Department (HOSPITAL_COMMUNITY): Payer: 59

## 2020-06-25 ENCOUNTER — Ambulatory Visit (HOSPITAL_COMMUNITY)
Admission: EM | Admit: 2020-06-25 | Discharge: 2020-06-25 | Disposition: A | Discharge status: Home / Self Care | Payer: 59 | Attending: Urgent Care | Admitting: Urgent Care

## 2020-06-25 ENCOUNTER — Encounter (HOSPITAL_COMMUNITY): Payer: Self-pay | Admitting: Emergency Medicine

## 2020-06-25 ENCOUNTER — Encounter (HOSPITAL_COMMUNITY): Payer: Self-pay

## 2020-06-25 ENCOUNTER — Other Ambulatory Visit: Payer: Self-pay

## 2020-06-25 ENCOUNTER — Emergency Department (HOSPITAL_COMMUNITY)
Admission: EM | Admit: 2020-06-25 | Discharge: 2020-06-25 | Disposition: A | Discharge status: Home / Self Care | Payer: 59 | Attending: Emergency Medicine | Admitting: Emergency Medicine

## 2020-06-25 DIAGNOSIS — R519 Headache, unspecified: Secondary | ICD-10-CM | POA: Diagnosis not present

## 2020-06-25 DIAGNOSIS — Z20822 Contact with and (suspected) exposure to covid-19: Secondary | ICD-10-CM | POA: Insufficient documentation

## 2020-06-25 DIAGNOSIS — Z79899 Other long term (current) drug therapy: Secondary | ICD-10-CM | POA: Diagnosis not present

## 2020-06-25 DIAGNOSIS — I10 Essential (primary) hypertension: Secondary | ICD-10-CM | POA: Diagnosis not present

## 2020-06-25 DIAGNOSIS — R42 Dizziness and giddiness: Secondary | ICD-10-CM | POA: Insufficient documentation

## 2020-06-25 DIAGNOSIS — R5383 Other fatigue: Secondary | ICD-10-CM | POA: Diagnosis not present

## 2020-06-25 LAB — CBC
HCT: 43.3 % (ref 36.0–46.0)
Hemoglobin: 14.4 g/dL (ref 12.0–15.0)
MCH: 31 pg (ref 26.0–34.0)
MCHC: 33.3 g/dL (ref 30.0–36.0)
MCV: 93.1 fL (ref 80.0–100.0)
Platelets: 182 10*3/uL (ref 150–400)
RBC: 4.65 MIL/uL (ref 3.87–5.11)
RDW: 12.3 % (ref 11.5–15.5)
WBC: 6.1 10*3/uL (ref 4.0–10.5)
nRBC: 0 % (ref 0.0–0.2)

## 2020-06-25 LAB — BASIC METABOLIC PANEL
Anion gap: 11 (ref 5–15)
BUN: 15 mg/dL (ref 6–20)
CO2: 26 mmol/L (ref 22–32)
Calcium: 9.5 mg/dL (ref 8.9–10.3)
Chloride: 103 mmol/L (ref 98–111)
Creatinine, Ser: 0.74 mg/dL (ref 0.44–1.00)
GFR calc Af Amer: >60 mL/min (ref >60)
GFR calc non Af Amer: >60 mL/min (ref >60)
Glucose, Bld: 91 mg/dL (ref 70–99)
Potassium: 3.8 mmol/L (ref 3.5–5.1)
Sodium: 140 mmol/L (ref 135–145)

## 2020-06-25 LAB — TROPONIN I (HIGH SENSITIVITY): Troponin I (High Sensitivity): <2 ng/L (ref <18)

## 2020-06-25 LAB — SARS CORONAVIRUS 2 BY RT PCR (HOSPITAL ORDER, PERFORMED IN ~~LOC~~ HOSPITAL LAB): SARS Coronavirus 2: NEGATIVE

## 2020-06-25 MED ORDER — SODIUM CHLORIDE 0.9 % IV BOLUS: 1000.0000 mL | Freq: Once | INTRAVENOUS | Status: DC

## 2020-06-25 MED ORDER — MECLIZINE HCL 25 MG PO TABS: 25.0000 mg | ORAL_TABLET | Freq: Three times a day (TID) | ORAL | 0 refills | Status: AC | PRN

## 2020-06-25 MED ORDER — MECLIZINE HCL 12.5 MG PO TABS
25.0000 mg | ORAL_TABLET | Freq: Once | ORAL | Status: AC
  Administered 2020-06-25: 25 mg via ORAL
  Filled 2020-06-25: qty 2

## 2020-06-25 NOTE — ED Notes (Signed)
Patient is being discharged from the Urgent Care and sent to the Emergency Department via POV. Per Roosvelt Maser PA, patient is in need of higher level of care due to need for cardiac work up. Patient is aware and verbalizes understanding of plan of care.  Vitals:   06/25/20 1741  BP: 123/85  Pulse: 63  Resp: 19  Temp: 98.5 F (36.9 C)  SpO2: 99%

## 2020-06-25 NOTE — Discharge Instructions (Signed)
Prescription given for Meclizine to help with your dizziness. You can take 1 tablet every 8 hours as needed for dizziness. Take medication as directed and do not operate machinery, drive a car, or work while taking this medication as it can make you drowsy.   Please follow up with your primary care provider and/or with the ear nose and throat doctor within 5-7 days for re-evaluation of your symptoms. If you do not have a primary care provider, information for a healthcare clinic has been provided for you to make arrangements for follow up care. Please return to the emergency department for any new or worsening symptoms.

## 2020-06-25 NOTE — ED Triage Notes (Addendum)
Pt sent over by [facility] for dizziness, tingling in her jaw and fingertips, HA, and nausea that began when she woke up this morning. States this happened two weeks ago but it resolved itself. Pt had EKG and was told it was normal, but was sent to ED for labs and a cardiac workup. Pt states she was hypertensive this morning when she noticed symptoms.

## 2020-06-25 NOTE — ED Notes (Signed)
Pt ambulates heel to toe without stagger, drift, or change of ease of movement

## 2020-06-25 NOTE — ED Provider Notes (Signed)
[facility]ENCY DEPARTMENT Provider Note   CSN: [id number] Arrival date & time: 06/25/20  1828     History Chief Complaint  Patient presents with  . Dizziness    Else T Rickey is a 49 y.o. female.  HPI   Pt is a 49 y/o female with a h/o heart murmur, RMSF, HTN, mitral valve, prolapse, who presents to the ED today for eval of dizziness. Pt describes the symptoms as a room spinning sensation.  Symptoms started earlier this morning when she woke up.  Symptoms are improved at rest but can be provoked by turning her head a certain way, leaning over, standing up too quick, and with other positional changes. Also had a mild HA earlier today which was relieved with ASA.   She reports associated fatigue, nausea, and  a tingling sensation to the left 4th/5th digits that has improved since onset. Has also had some fullness to the ears and burning sensation to the bilat TMJs. Denies recent fevers, URI, GI, GU sxs. She denies any associated chest pain, shortness of breath, weakness, vision changes.   J&J vaccine in June  Past Medical History:  Diagnosis Date  . Abnormal Pap smear    ASCUS  . AMA (advanced maternal age) multigravida 35+   . Dysrhythmia   . Heart murmur    antibiotic with dental work  . [facility] of [facility]   . HTN (hypertension) -  . Mitral valve prolapse     Patient Active Problem List   Diagnosis Date Noted  . Hypertensive disorder 05/09/2018  . Weight increased 05/09/2018  . PAC (premature atrial contraction) 05/28/2011  . MVP (mitral valve prolapse) 05/28/2011    Past Surgical History:  Procedure Laterality Date  . DILATION AND CURETTAGE OF UTERUS    . FACIAL COSMETIC SURGERY    . FOOT SURGERY    . GYNECOLOGIC CRYOSURGERY    . TONSILLECTOMY       OB History    Gravida  7   Para  4   Term  4   Preterm      AB  3   Living  4     SAB  2   TAB  1   Ectopic      Multiple      Live Births  3           Family  History  Problem Relation Age of Onset  . Arthritis Mother        grandmother  . Endometriosis Mother   . Fibromyalgia Mother   . Other Mother        still birth  . Hypertension Mother   . Miscarriages / [place] Mother        still birth  . Heart disease Other        grandparents (both sides of family)  . Hypertension Father        grandparents (both sides of family)  . Arthritis Maternal Grandmother   . Heart disease Maternal Grandmother   . Hypertension Maternal Grandmother   . Heart disease Maternal Grandfather   . Hypertension Maternal Grandfather   . Heart disease Paternal Grandmother   . Hypertension Paternal Grandmother   . Hypertension Paternal Grandfather     Social History   Tobacco Use  . Smoking status: Never Smoker  . Smokeless tobacco: Never Used  Vaping Use  . Vaping Use: Never used  Substance Use Topics  . Alcohol use: No  . Drug  use: No    Home Medications Prior to Admission medications   Medication Sig Start Date End Date Taking? Authorizing Provider  hydrochlorothiazide (HYDRODIURIL) 25 MG tablet Take by mouth. 12/27/17   [provider]  lisinopril (PRINIVIL,ZESTRIL) 10 MG tablet Take 10 mg by mouth daily.    [provider]  meclizine (ANTIVERT) 25 MG tablet Take 1 tablet (25 mg total) by mouth 3 (three) times daily as needed for dizziness. 06/25/20   Campbell Agramonte S, PA-C    Allergies    Patient has no known allergies.  Review of Systems   Review of Systems  Constitutional: Positive for fatigue. Negative for fever.  HENT: Negative for ear pain and sore throat.        Fullness to ears  Eyes: Negative for pain and visual disturbance.  Respiratory: Negative for cough and shortness of breath.   Cardiovascular: Negative for chest pain.  Gastrointestinal: Positive for nausea. Negative for abdominal pain, constipation, diarrhea and vomiting.  Genitourinary: Negative for dysuria.  Musculoskeletal: Negative for back pain.   Skin: Negative for rash.  Neurological: Positive for dizziness and headaches. Negative for speech difficulty, weakness and numbness.       Paresthesias to left 4th/5th digit  All other systems reviewed and are negative.   Physical Exam Updated Vital Signs BP (!) 159/94 (BP Location: Right Arm)   Pulse 65   Temp 97.9 F (36.6 C) (Oral)   Resp 14   Ht 5\' 5"  (1.651 m)   Wt 93.9 kg   LMP 01/13/2014   SpO2 100%   BMI 34.45 kg/m   Physical Exam Vitals and nursing note reviewed.  Constitutional:      General: She is not in acute distress.    Appearance: She is well-developed.  HENT:     Head: Normocephalic and atraumatic.     Ears:     Comments: Right TM bulging, left TM wnl. Eyes:     Extraocular Movements: Extraocular movements intact.     Conjunctiva/sclera: Conjunctivae normal.     Pupils: Pupils are equal, round, and reactive to light.     Comments: Leftward beating nystagmus  Cardiovascular:     Rate and Rhythm: Normal rate and regular rhythm.     Heart sounds: Normal heart sounds. No murmur heard.   Pulmonary:     Effort: Pulmonary effort is normal. No respiratory distress.     Breath sounds: Normal breath sounds. No wheezing, rhonchi or rales.  Abdominal:     Palpations: Abdomen is soft.     Tenderness: There is no abdominal tenderness.  Musculoskeletal:     Cervical back: Neck supple.  Skin:    General: Skin is warm and dry.  Neurological:     Mental Status: She is alert.     Comments: Mental Status:  Alert, thought content appropriate, able to give a coherent history. Speech fluent without evidence of aphasia. Able to follow 2 step commands without difficulty.  Cranial Nerves:  II: pupils equal, round, reactive to light III,IV, VI: ptosis not present, extra-ocular motions intact bilaterally  V,VII: smile symmetric, facial light touch sensation equal VIII: hearing grossly normal to voice  X: uvula elevates symmetrically  XI: bilateral shoulder shrug  symmetric and strong XII: midline tongue extension without fassiculations Motor:  Normal tone. 5/5 strength of BUE and BLE major muscle groups including strong and equal grip strength and dorsiflexion/plantar flexion Sensory: light touch normal in all extremities with exception of paresthesias to the tips of the  4th/5th digits of the left hand Cerebellar: normal finger-to-nose with bilateral upper extremities, normal heel to shin Gait: normal gait and balance.      ED Results / Procedures / Treatments   Labs (all labs ordered are listed, but only abnormal results are displayed) Labs Reviewed  SARS CORONAVIRUS 2 BY RT PCR (HOSPITAL ORDER, PERFORMED IN [facility])  BASIC METABOLIC PANEL  CBC  CBG MONITORING, ED  TROPONIN I (HIGH SENSITIVITY)  TROPONIN I (HIGH SENSITIVITY)    EKG EKG Interpretation  Date/Time:  Sunday June 25 2020 18:39:31 EDT Ventricular Rate:  63 PR Interval:  136 QRS Duration: 88 QT Interval:  412 QTC Calculation: 421 R Axis:   39 Text Interpretation: Normal sinus rhythm Normal ECG No significant change since last tracing Confirmed by Knapp, Jon ([id number]) on 06/25/2020 7:07:09 PM   Radiology DG Chest 2 View  Result Date: 06/25/2020 CLINICAL DATA:  Left hand tingling and dizziness. EXAM: CHEST - 2 VIEW COMPARISON:  None. FINDINGS: Mild linear atelectasis is seen within the left lung base. There is no evidence of a pleural effusion or pneumothorax. The heart size and mediastinal contours are within normal limits. The visualized skeletal structures are unremarkable. IMPRESSION: Mild left basilar linear atelectasis. Electronically Signed   By: Thaddeus  Houston M.D.   On: 06/25/2020 19:11   CT Head Wo Contrast  Result Date: 06/25/2020 CLINICAL DATA:  Vertigo EXAM: CT HEAD WITHOUT CONTRAST TECHNIQUE: Contiguous axial images were obtained from the base of the skull through the vertex without intravenous contrast. COMPARISON:  None. FINDINGS: Brain:  No acute territorial infarction, hemorrhage, or intracranial mass. The ventricles are nonenlarged. Vascular: No hyperdense vessel or unexpected calcification. Skull: Normal. Negative for fracture or focal lesion. Sinuses/Orbits: No acute finding. Other: None IMPRESSION: Negative non contrasted CT appearance of the brain. Electronically Signed   By: Kim  Fujinaga M.D.   On: 06/25/2020 21:36    Procedures Procedures (including critical care time)  Medications Ordered in ED Medications  meclizine (ANTIVERT) tablet 25 mg (25 mg Oral Given 06/25/20 2158)    ED Course  I have reviewed the triage vital signs and the nursing notes.  Pertinent labs & imaging results that were available during my care of the patient were reviewed by me and considered in my medical decision making (see chart for details).    MDM Rules/Calculators/A&P                          49  year old female presenting for evaluation of dizziness.  Describes vertiginous symptoms that can be provoked with certain movements.  Reviewed/interpreted labs CBC unremarkable BMP unremarkable Trop neg - have very low suspicion for ACS, no CP or EKG changes COVID neg  CXR reviewed/interpreted - Mild left basilar linear atelectasis.  CT head reviewed/interpreted - Negative non contrasted CT appearance of the brain.  Pt given meclizine and oral fluids. On reassessment, she feels improved after meclizine.  She is able to ambulate in the room without difficulty.  We discussed the results of the CT scan and remainder of the work-up being reassuring.  I suspect that symptoms are secondary to peripheral cause of vertigo.  Have very low suspicion for central cause of symptoms.  Will refer her to ENT and give Rx for meclizine for home.  Have advised that she monitor symptoms closely and return the emergency department for new or worsening symptoms.  She voiced understanding the plan reasons return.  All questions answered.  Patient stable for  discharge.  Final Clinical Impression(s) / ED Diagnoses Final diagnoses:  Dizziness    Rx / DC Orders ED Discharge Orders         Ordered    meclizine (ANTIVERT) 25 MG tablet  3 times daily PRN        06/25/20 2253           Karrie Meres, PA-C 06/25/20 2257    Linwood Dibbles, MD 06/27/20 [id number]

## 2020-06-25 NOTE — ED Notes (Signed)
Pt reports she awakened this am with HA, dizziness   Then noticed that her middle thru little fingers on her L hand were "tingling"    Then noticed that her bilateral TMJ were "tinling like after you blow up a balloon"  Seen at [facility] and sent here for further eval

## 2020-06-25 NOTE — ED Triage Notes (Signed)
Pt presents with complaints of dizziness that started this am. Reports that she had it happen once two weeks ago as well. Reports she is also having numbness in her fingers, jaws, and feeling nauseous.

## 2021-01-29 IMAGING — CT CT HEAD W/O CM
3 series · 16 of 47 positions shown, 19 images · non-contrast
Comparison: None.

CLINICAL DATA: Vertigo

EXAM:
CT HEAD WITHOUT CONTRAST
TECHNIQUE: Contiguous axial images were obtained from the base of the skull
through the vertex without intravenous contrast.

[Series 2: head w o · axial · 0.44mm/px · z∈[+19,+154]mm · 10 of 33 slices shown, 13 images]
[im 3/33  brain]
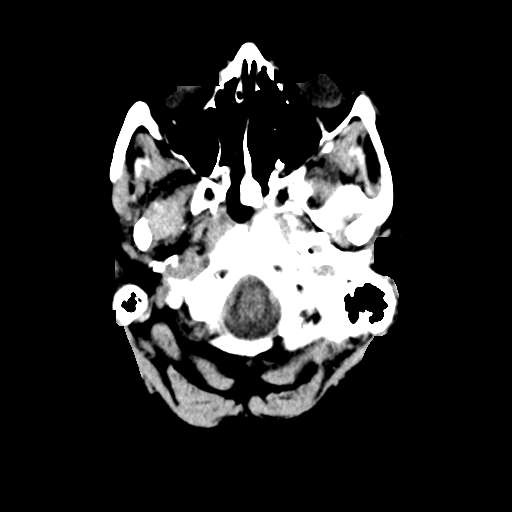
[im 3/33  bone]
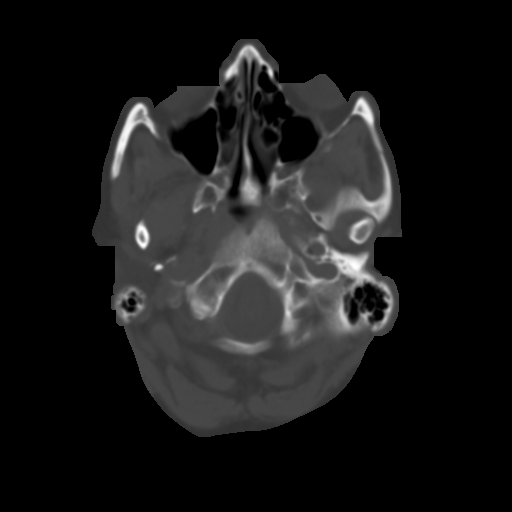
[im 6/33  brain]
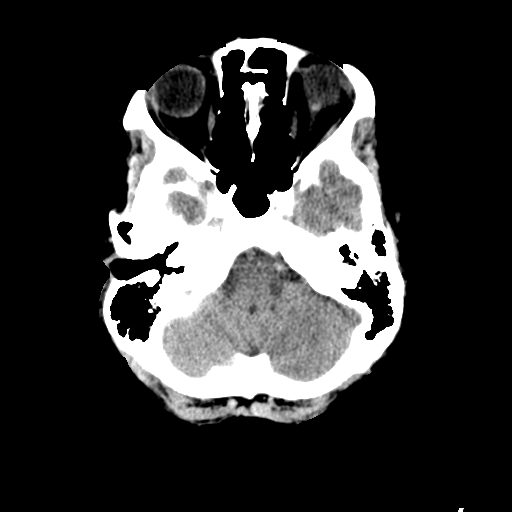
[im 9/33  brain]
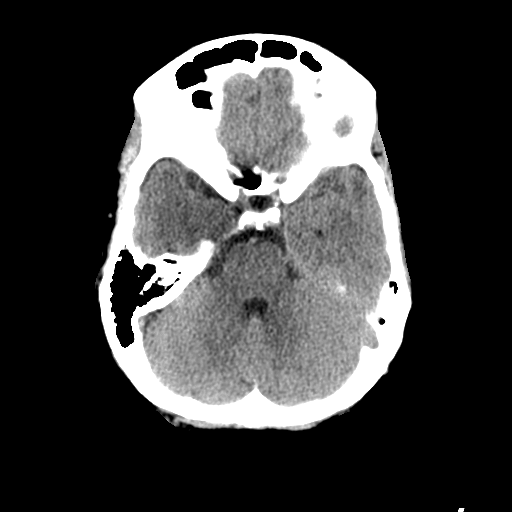
[im 12/33  brain]
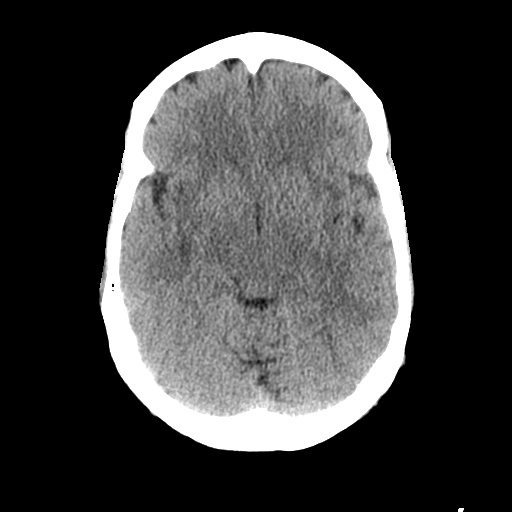
[im 15/33  brain]
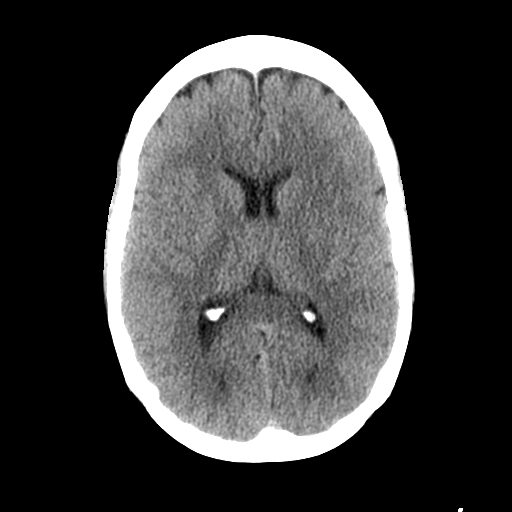
[im 15/33  bone]
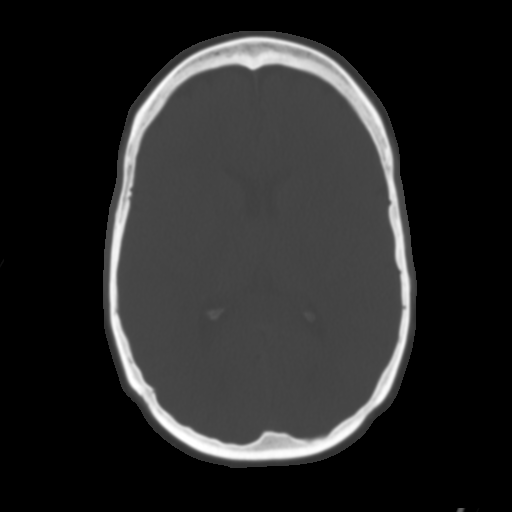
[im 18/33  brain]
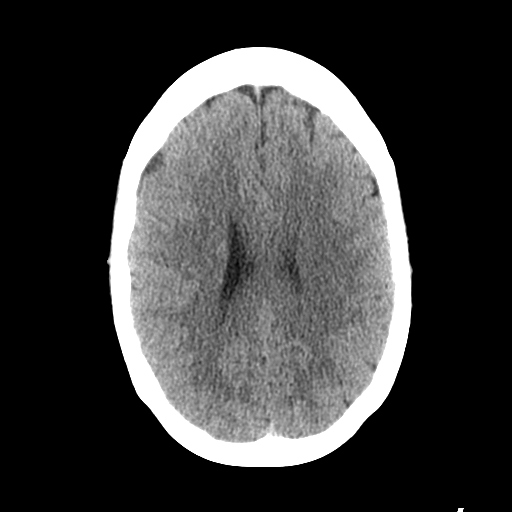
[im 21/33  brain]
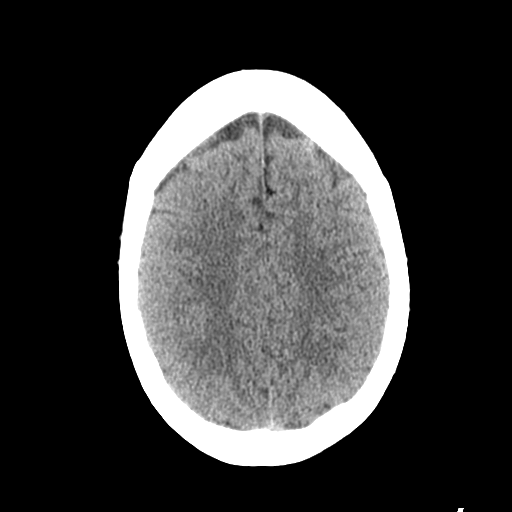
[im 25/33  brain]
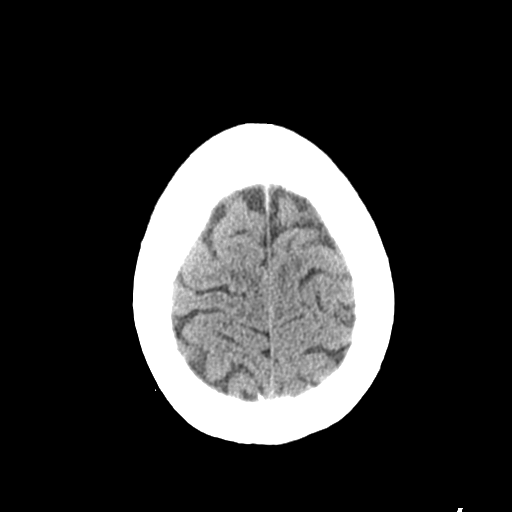
[im 27/33  brain]
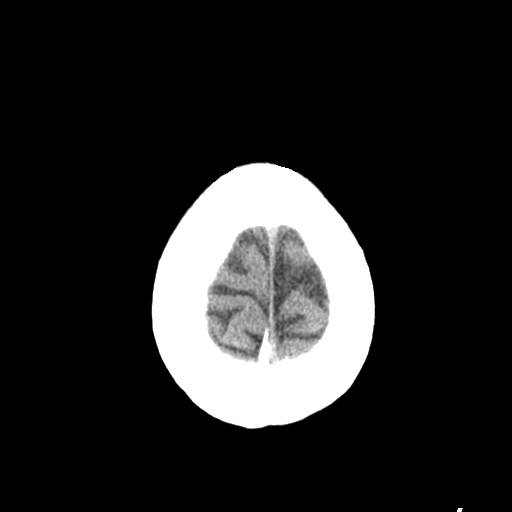
[im 27/33  bone]
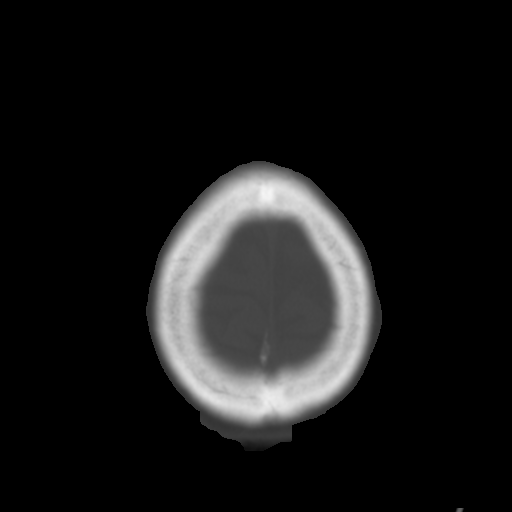
[im 30/33  brain]
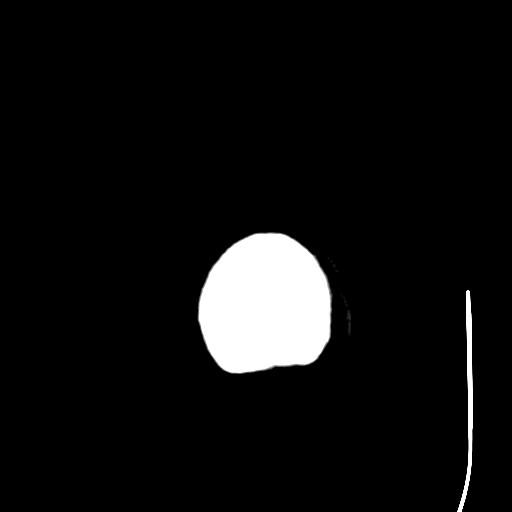

[Series 4: coronal soft · coronal · 0.40mm/px · 3 of 71 slices shown]
[im 24/71  brain]
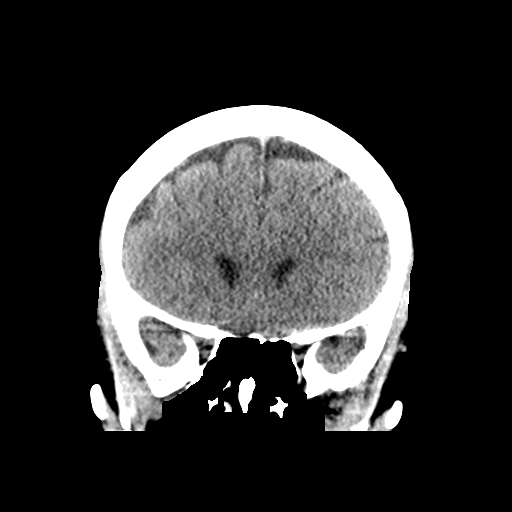
[im 32/71  brain]
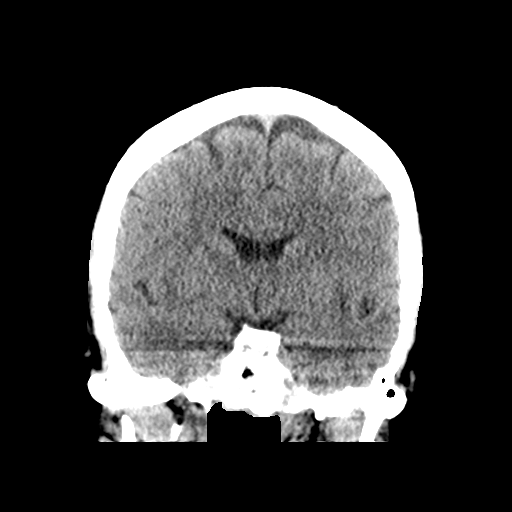
[im 39/71  brain]
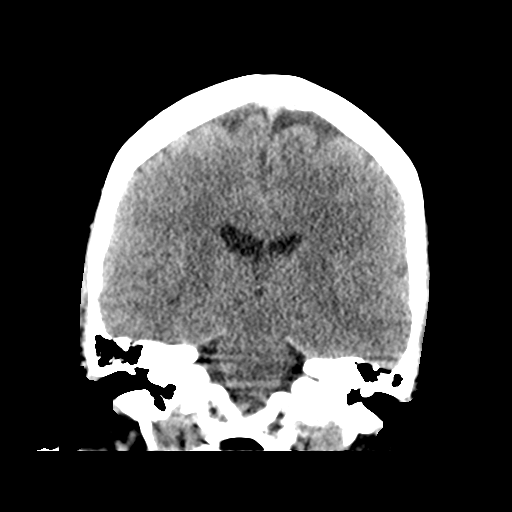

[Series 5: sagittal soft · sagittal · 0.37mm/px · 3 of 67 slices shown]
[im 23/67  brain]
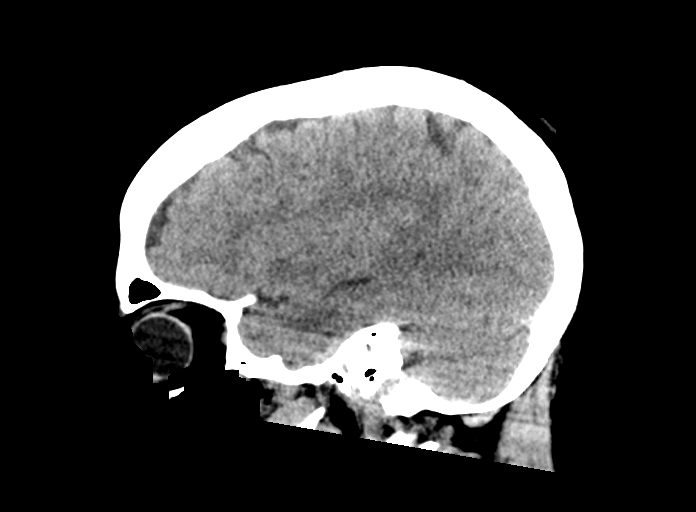
[im 34/67  brain]
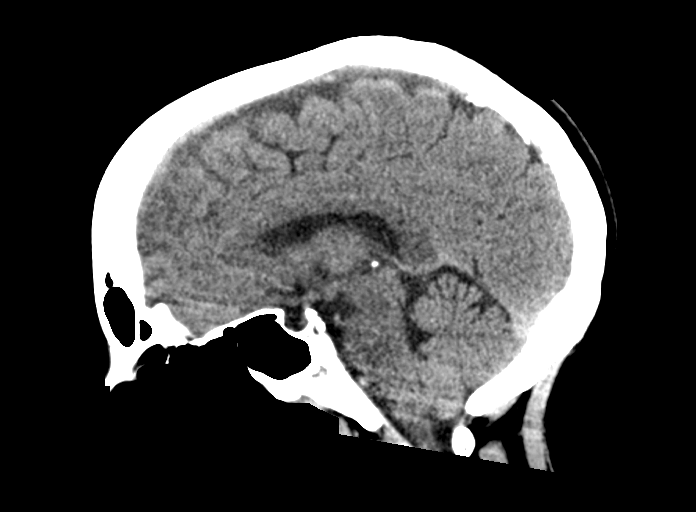
[im 45/67  brain]
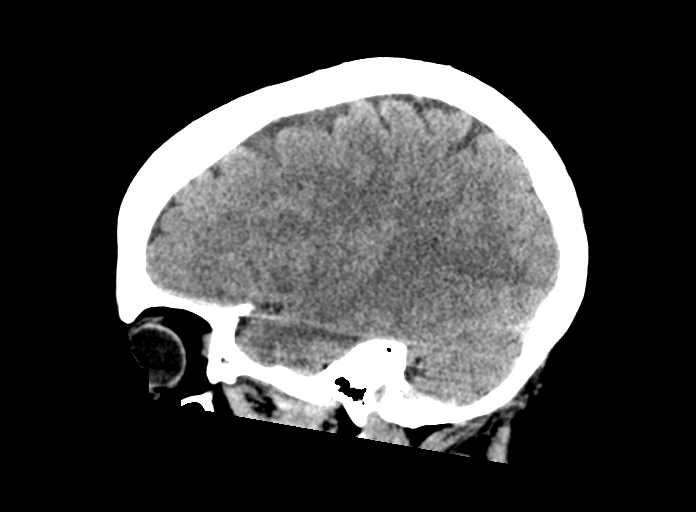

[16 of 47 positions shown; findings below may reference images not displayed]

FINDINGS: Brain: No acute territorial infarction, hemorrhage, or intracranial
mass. The ventricles are nonenlarged.

Vascular: No hyperdense vessel or unexpected calcification.

Skull: Normal. Negative for fracture or focal lesion.

Sinuses/Orbits: No acute finding.

Other: None
IMPRESSION: Negative non contrasted CT appearance of the brain.

## 2022-06-10 ENCOUNTER — Ambulatory Visit: Payer: 59 | Admitting: Podiatry

## 2022-08-09 ENCOUNTER — Other Ambulatory Visit: Payer: Self-pay | Admitting: Obstetrics and Gynecology

## 2022-08-09 DIAGNOSIS — R928 Other abnormal and inconclusive findings on diagnostic imaging of breast: Secondary | ICD-10-CM

## 2022-08-26 ENCOUNTER — Other Ambulatory Visit: Payer: 59

## 2022-09-12 ENCOUNTER — Ambulatory Visit
Admission: RE | Admit: 2022-09-12 | Discharge: 2022-09-12 | Disposition: A | Discharge status: Home / Self Care | Payer: 59 | Source: Ambulatory Visit | Attending: Obstetrics and Gynecology | Admitting: Obstetrics and Gynecology

## 2022-09-12 DIAGNOSIS — R928 Other abnormal and inconclusive findings on diagnostic imaging of breast: Secondary | ICD-10-CM

## 2022-12-06 DIAGNOSIS — I10 Essential (primary) hypertension: Secondary | ICD-10-CM | POA: Insufficient documentation

## 2024-06-07 ENCOUNTER — Encounter: Payer: Self-pay | Admitting: Podiatry

## 2024-06-07 ENCOUNTER — Ambulatory Visit: Admitting: Podiatry

## 2024-06-07 ENCOUNTER — Ambulatory Visit (INDEPENDENT_AMBULATORY_CARE_PROVIDER_SITE_OTHER)

## 2024-06-07 VITALS — Ht 65.0 in | Wt 150.0 lb

## 2024-06-07 DIAGNOSIS — M21611 Bunion of right foot: Secondary | ICD-10-CM

## 2024-06-07 DIAGNOSIS — M2011 Hallux valgus (acquired), right foot: Secondary | ICD-10-CM

## 2024-06-07 NOTE — Progress Notes (Signed)
 Chief Complaint  Patient presents with   Foot Pain    R foot bunion. Pain has really bothered her the last year. Would like to repair.  Not diabetic no anti coag.   HPI: 53 y.o. female presents today for bunion evaluation.  States her right bunion has been progressively worsening with pain over the past 1 to 2 years.  She had the left bunion corrected many years ago and is pleased with her results.  Notes no pain in that area on the left.  She does have mitral valve prolapse but states it is well-controlled and her cardiologist is not even sure she is still dealing with that and does not have a follow-up scheduled with cardiology.  She would like to proceed with bunion correction on the right foot.  She notes she would like to have the surgery performed before the end of November when her husband's insurance coverage will change when he retires from the police department  Past Medical History:  Diagnosis Date   Abnormal Pap smear    ASCUS   AMA (advanced maternal age) multigravida 35+    Dysrhythmia    Heart murmur    antibiotic with dental work   History of [employer] spotted fever    HTN (hypertension) -   Mitral valve prolapse     Past Surgical History:  Procedure Laterality Date   DILATION AND CURETTAGE OF UTERUS     FACIAL COSMETIC SURGERY     FOOT SURGERY     GYNECOLOGIC CRYOSURGERY     TONSILLECTOMY      No Known Allergies  Review of Systems  Musculoskeletal:  Positive for joint pain.      PHYSICAL EXAM:  General: The patient is alert and oriented x3 in no acute distress.  Dermatology: Skin is warm, dry and supple bilateral lower extremities. Interspaces are clear of maceration and debris.  No rashes noted.   Vascular: Palpable pedal pulses bilaterally. Capillary refill within normal limits.  No appreciable edema.  No erythema or calor.  Neurological: Light touch sensation grossly intact bilateral feet.   Musculoskeletal Exam:  There is a bony medial  prominence on the dorsomedial aspect of the 1st metatarsal head of the right foot.  There is pain on palpation of the bump in this area.  Lateral deviation of the hallux at the MPJ level.  1st MPJ ROM is mildly decreased.  No crepitus.  Hallux is tracking, not trackbound.    RADIOGRAPHIC EXAM (right foot, 3 weightbearing views, 06/07/2024):  Increased first intermetatarsal angle 16 degrees.  Increased hallux abductus angle.  Enlargement of bone at dorsomedial 1st metatarsal head.  Joint space is decreased.  ASSESSMENT/PLAN OF CARE: 1. Bunion, right foot   2. Hallux valgus (acquired), right foot     Discussed patient's condition and possible etiologies today.  Discussed conservative treatment options with patient today, including shoe modification / arch supports, off-loading, cortisone injectione, NSAID topical / oral therapy, and toe splints and shields.  Briefly discussed surgical intervention if conservative options are not successful.    Patient was informed that the insurance companies typically want her to have tried some conservative measures first.  Therefore toe spacers were dispensed to wear during the day and shoe gear, and a toe alignment splint was dispensed for the patient to wear when sleeping.  This will hold the hallux in a better position.  I will follow-up in 1 month with the expectation to proceed with surgical planning and  complete and reviewed the surgical consent forms.  Return in about 4 weeks (around 07/05/2024) for surgery consult for bunion surgery R foot (XR on file).  Awanda CHARM Imperial, DPM, FACFAS [facility]     [address] [place], [place] [address]                Office [phone]  Fax [phone]

## 2024-07-05 ENCOUNTER — Ambulatory Visit: Admitting: Podiatry

## 2024-07-13 ENCOUNTER — Encounter: Payer: Self-pay | Admitting: Podiatry

## 2024-07-13 ENCOUNTER — Ambulatory Visit: Admitting: Podiatry

## 2024-07-13 DIAGNOSIS — M21611 Bunion of right foot: Secondary | ICD-10-CM | POA: Diagnosis not present

## 2024-07-13 DIAGNOSIS — M2011 Hallux valgus (acquired), right foot: Secondary | ICD-10-CM | POA: Diagnosis not present

## 2024-07-13 DIAGNOSIS — M79671 Pain in right foot: Secondary | ICD-10-CM

## 2024-07-13 NOTE — Progress Notes (Unsigned)
 Chief Complaint  Patient presents with   surgery consult    surgery consult for bunion surgery R foot. 0 pain. Non diabetic.   HPI: 52 y.o. female presents today for preoperative visit for right bunion surgery.  States her right bunion has been progressively worsening with pain over the past 1 to 2 years.  She had the left bunion corrected many years ago and is pleased with her results.  Notes no pain in that area on the left.  She does have mitral valve prolapse but states it is well-controlled and her cardiologist is not even sure she is still dealing with that and does not have a follow-up scheduled with cardiology.  She would like to proceed with bunion correction on the right foot.  She notes she would like to have the surgery performed before the end of November when her husband's insurance coverage will change when he retires from the police department.  States she has had recent full panel blood work performed by her primary care provider.  They are not within our system.  Past Medical History:  Diagnosis Date   Abnormal Pap smear    ASCUS   AMA (advanced maternal age) multigravida 35+    Dysrhythmia    Heart murmur    antibiotic with dental work   History of [employer] spotted fever    HTN (hypertension) -   Mitral valve prolapse     Past Surgical History:  Procedure Laterality Date   DILATION AND CURETTAGE OF UTERUS     FACIAL COSMETIC SURGERY     FOOT SURGERY     GYNECOLOGIC CRYOSURGERY     TONSILLECTOMY     No Known Allergies  Review of Systems  Musculoskeletal:  Positive for joint pain.     PHYSICAL EXAM:  General: The patient is alert and oriented x3 in no acute distress.  Dermatology: Skin is warm, dry and supple bilateral lower extremities. Interspaces are clear of maceration and debris.  No rashes noted.   Vascular: Palpable pedal pulses bilaterally. Capillary refill within normal limits.  No appreciable edema.  No erythema or calor.  Neurological:  Light touch sensation grossly intact bilateral feet.   Musculoskeletal Exam:  There is a bony medial prominence on the dorsomedial aspect of the 1st metatarsal head of the right foot.  There is pain on palpation of the bump in this area.  Lateral deviation of the hallux at the MPJ level.  1st MPJ ROM is mildly decreased.  No crepitus.  Hallux is tracking, not trackbound.    RADIOGRAPHIC EXAM (right foot, 3 weightbearing views, from 06/07/2024):  Increased first intermetatarsal angle 16 degrees.  Increased hallux abductus angle.  Enlargement of bone at dorsomedial 1st metatarsal head.  Joint space is decreased.  ASSESSMENT/PLAN OF CARE: 1. Bunion, right foot   2. Hallux valgus (acquired), right foot   3. Pain in right foot      Discussed patient's condition and possible etiologies today.  Discussed conservative treatment options with patient today, including shoe modification / arch supports, off-loading, cortisone injectione, NSAID topical / oral therapy, and toe splints and shields.  Briefly discussed surgical intervention if conservative options are not successful.    Patient has exhausted all conservative measures at this time.  Shoe modifications, toe splints and bunion splints have been unsuccessful in alleviating her discomfort.  Any anti-inflammatories have not provided improvement.  We discussed surgical correction of the bunion via Austin/Aiken procedure.  This would be performed  outpatient at [facility] center under intravenous sedation with local anesthesia.  We also discussed the postoperative course including a postoperative surgical dressing with a surgical shoe.  She will need either a walker or a knee scooter for mobilization postoperatively.  She will need to rest, ice and elevate the foot.  She is at mild DVT risk due to perimenopausal status, current use of HRT, and history of mitral valve prolapse (patient relays that this is well-controlled).  Her primary  care provider may need to address any need for perioperative DVT prophylaxis.  I would at least recommend aspirin postoperatively.  Benefits, risk, and possible postoperative complications were discussed with the patient.  Also discussed possible sequela if the patient opted not to proceed with any surgical intervention.  Verbal and written consent were obtained preoperatively.  Will try to schedule this procedure prior to the end of November when her husband's insurance coverage ends due to his retirement from the police force.  Will aim for early November or late October.  Awanda CHARM Imperial, DPM, FACFAS [facility]     [address] [place], [place] [address]                Office [phone]  Fax [phone]

## 2024-07-22 ENCOUNTER — Telehealth: Payer: Self-pay | Admitting: Podiatry

## 2024-07-22 NOTE — Telephone Encounter (Signed)
 DOS- 10/15/2023  [place] OSTEOTOMY RT- [id number] [place] BUNIONECTOMY RT- [id number]  [employer] EFFECTIVE DATE- 10/15/2023  DEDUCTIBLE- $500 REMAINING- $490.32 OOP- $2500 REMAINING- $2292.36 FAMILY DEDUCTIBLE- $1000 REMAINING- $737.78 FAMILY OOP- $5000 REMAINING- $3493.21 COINSURANCE- 20%  PER [employer] PORTAL, PRIOR AUTH FOR CPT CODE 71689 AND 28296 HAVE BEEN APPROVED FROM 08/10/2024-11/08/2024. AUTH# [id number]

## 2024-08-02 ENCOUNTER — Ambulatory Visit: Admitting: Podiatry

## 2024-08-17 ENCOUNTER — Encounter

## 2024-08-17 ENCOUNTER — Telehealth: Payer: Self-pay | Admitting: Podiatry

## 2024-08-17 NOTE — Telephone Encounter (Signed)
 Patient called and left message on voicemail that she wants to wait until after the new year and after her insurance policies deductible has been met before proceeding with surgery. Patient states she will call office back in December with new insurance information and to reschedule surgery then. Returned patient's call to inform I received her message.

## 2024-08-17 NOTE — Patient Instructions (Incomplete)
 SABRA

## 2024-08-24 ENCOUNTER — Encounter

## 2024-09-06 ENCOUNTER — Encounter: Admitting: Podiatry
